# Patient Record
Sex: Male | Born: 1975 | Race: White | Hispanic: No | Marital: Single | Smoking: Current every day smoker
Health system: Southern US, Community
[De-identification: ages and names within clinical notes are randomized; demographics above are authoritative.]

## PROBLEM LIST (undated history)

## (undated) DIAGNOSIS — I639 Cerebral infarction, unspecified: Secondary | ICD-10-CM

## (undated) DIAGNOSIS — J45909 Unspecified asthma, uncomplicated: Secondary | ICD-10-CM

---

## 2010-01-20 DIAGNOSIS — I639 Cerebral infarction, unspecified: Secondary | ICD-10-CM

## 2010-01-20 HISTORY — DX: Cerebral infarction, unspecified: I63.9

## 2012-11-28 ENCOUNTER — Emergency Department (HOSPITAL_COMMUNITY)
Admission: EM | Admit: 2012-11-28 | Discharge: 2012-11-28 | Disposition: A | Discharge status: Home / Self Care | Payer: Medicaid Other | Attending: Emergency Medicine | Admitting: Emergency Medicine

## 2012-11-28 ENCOUNTER — Encounter (HOSPITAL_COMMUNITY): Payer: Self-pay | Admitting: Emergency Medicine

## 2012-11-28 ENCOUNTER — Emergency Department (HOSPITAL_COMMUNITY): Payer: Medicaid Other

## 2012-11-28 DIAGNOSIS — J45909 Unspecified asthma, uncomplicated: Secondary | ICD-10-CM | POA: Insufficient documentation

## 2012-11-28 DIAGNOSIS — Y9289 Other specified places as the place of occurrence of the external cause: Secondary | ICD-10-CM | POA: Insufficient documentation

## 2012-11-28 DIAGNOSIS — Y9389 Activity, other specified: Secondary | ICD-10-CM | POA: Insufficient documentation

## 2012-11-28 DIAGNOSIS — Z8673 Personal history of transient ischemic attack (TIA), and cerebral infarction without residual deficits: Secondary | ICD-10-CM | POA: Insufficient documentation

## 2012-11-28 DIAGNOSIS — G8190 Hemiplegia, unspecified affecting unspecified side: Secondary | ICD-10-CM | POA: Insufficient documentation

## 2012-11-28 DIAGNOSIS — IMO0002 Reserved for concepts with insufficient information to code with codable children: Secondary | ICD-10-CM | POA: Insufficient documentation

## 2012-11-28 DIAGNOSIS — F17200 Nicotine dependence, unspecified, uncomplicated: Secondary | ICD-10-CM | POA: Insufficient documentation

## 2012-11-28 DIAGNOSIS — W010XXA Fall on same level from slipping, tripping and stumbling without subsequent striking against object, initial encounter: Secondary | ICD-10-CM | POA: Insufficient documentation

## 2012-11-28 DIAGNOSIS — Z79899 Other long term (current) drug therapy: Secondary | ICD-10-CM | POA: Insufficient documentation

## 2012-11-28 DIAGNOSIS — S8391XA Sprain of unspecified site of right knee, initial encounter: Secondary | ICD-10-CM

## 2012-11-28 HISTORY — DX: Cerebral infarction, unspecified: I63.9

## 2012-11-28 HISTORY — DX: Unspecified asthma, uncomplicated: J45.909

## 2012-11-28 MED ORDER — OXYCODONE-ACETAMINOPHEN 5-325 MG PO TABS: 1.0000 | ORAL_TABLET | ORAL | Status: DC | PRN

## 2012-11-28 MED ORDER — OXYCODONE-ACETAMINOPHEN 5-325 MG PO TABS
1.0000 | ORAL_TABLET | Freq: Once | ORAL | Status: AC
  Administered 2012-11-28: 1 via ORAL
  Filled 2012-11-28: qty 1

## 2012-11-28 NOTE — ED Notes (Signed)
Pt c/o right knee pain, swelling and difficulty putting pressure on right leg d/t pain. Hx of stroke that caused right sided weakness. sts he fell yesterday in the shower. Denies hitting head/HA. No new neuro deficits. Pt has right sided weakness/numbness from previous stroke. Nad, skin warm and dry, resp e/u.

## 2012-11-28 NOTE — ED Provider Notes (Addendum)
CSN: [id number]     Arrival date & time 11/28/12  0848 History   First MD Initiated Contact with Patient 11/28/12 [phone]     Chief Complaint  Patient presents with  . Knee Pain   (Consider location/radiation/quality/duration/timing/severity/associated sxs/prior Treatment) HPI Comments: Patient presents to the ER for evaluation of right knee pain. Patient reports that he fell in the shower yesterday. 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 He lost his balance, causing the fall. He did cut himself with the other arm, no head injury. He says he felt fine after the fall, no pain. Overnight, however, he developed pain in the right knee. He notices this morning that the knee is swollen. Pain worsens with movement. Pain is sharp, constant.  Patient is a 37 y.o. male presenting with knee pain.  Knee Pain   Past Medical History  Diagnosis Date  . Asthma   . Stroke 2012    right sided deficits   History reviewed. No pertinent past surgical history. No family history on file. History  Substance Use Topics  . Smoking status: Current Every Day Smoker -- 0.50 packs/day    Types: Cigarettes  . Smokeless tobacco: Not on file  . Alcohol Use: No    Review of Systems  Musculoskeletal: Positive for arthralgias.  Neurological: Negative for dizziness, syncope and headaches.  All other systems reviewed and are negative.    Allergies  Review of patient's allergies indicates no known allergies.  Home Medications   Current Outpatient Rx  Name  Route  Sig  Dispense  Refill  . amitriptyline (ELAVIL) 100 MG tablet   Oral   Take 100 mg by mouth at bedtime.          There were no vitals taken for this visit. Physical Exam  Constitutional: He is oriented to person, place, and time. He appears well-developed and well-nourished. No distress.  HENT:  Head: Normocephalic and atraumatic.  Right Ear: Hearing normal.  Left Ear: Hearing normal.  Nose: Nose normal.  Mouth/Throat:  Oropharynx is clear and moist and mucous membranes are normal.  Eyes: Conjunctivae and EOM are normal. Pupils are equal, round, and reactive to light.  Neck: Normal range of motion. Neck supple.  Cardiovascular: Regular rhythm, S1 normal and S2 normal.  Exam reveals no gallop and no friction rub.   No murmur heard. Pulmonary/Chest: Effort normal and breath sounds normal. No respiratory distress. He exhibits no tenderness.  Abdominal: Soft. Normal appearance and bowel sounds are normal. There is no hepatosplenomegaly. There is no tenderness. There is no rebound, no guarding, no tenderness at McBurney's point and negative Murphy's sign. No hernia.  Musculoskeletal: Normal range of motion.       Right knee: He exhibits swelling and effusion. He exhibits no ecchymosis, no deformity and no erythema. Tenderness found.  Neurological: He is alert and oriented to person, place, and time. He has normal strength. No sensory deficit. GCS eye subscore is 4. GCS verbal subscore is 5. GCS motor subscore is 6.  Right hemiparesis is chronic, no new focal finding  Skin: Skin is warm, dry and intact. No rash noted. No cyanosis.  Psychiatric: He has a normal mood and affect. His speech is normal and behavior is normal. Thought content normal.    ED Course  Procedures (including critical care time) Labs Review Labs Reviewed - No data to display Imaging Review Dg Knee Complete 4 Views Right  11/28/2012   CLINICAL DATA:  Fall, knee pain  EXAM:  RIGHT KNEE - COMPLETE 4+ VIEW  COMPARISON:  None.  FINDINGS: Four views of the right knee submitted. No acute fracture or subluxation. No radiopaque foreign body. No joint effusion.  IMPRESSION: Negative.   Electronically Signed   By: Natasha Mead M.D.   On: 11/28/2012 10:03    EKG Interpretation   None       MDM  Diagnosis: Right knee sprain  Patient presents to the ER for evaluation of right knee injury. Patient had a fall yesterday. He reports pain began overnight  he now has swelling of the right knee. I feel a slight effusion, possibly prepatellar, possibly in the joint. There is no erythema or increased warmth. No suspicion for joint infection. X-ray does not show any acute abnormality. Patient having trouble with ambulation secondary to pain. The patient will be given a knee immobilizer for stability, Percocet for pain and is to followup with orthopedics. Return if symptoms worsen, especially fever, redness or increased swelling.    Gilda Crease, MD 11/28/12 1032  Gilda Crease, MD 11/28/12 1043

## 2012-11-28 NOTE — ED Notes (Signed)
Per EMS - pt fell in shower yesterday but no pain afterwards. Around 1am this morning he woke up with pain and swelling to right knee, unable to put a lot of pressure on leg. Pt has hx of stroke with right sided weakness but reports he is unable to walk on it d/t the pain not weakness. No deformities noted. BP 128/80 HR 80 RR 16. Rating pain at 8/10.

## 2012-12-09 ENCOUNTER — Emergency Department (HOSPITAL_COMMUNITY)
Admission: EM | Admit: 2012-12-09 | Discharge: 2012-12-09 | Disposition: A | Discharge status: Home / Self Care | Payer: Medicaid Other | Attending: Emergency Medicine | Admitting: Emergency Medicine

## 2012-12-09 ENCOUNTER — Encounter (HOSPITAL_COMMUNITY): Payer: Self-pay | Admitting: Emergency Medicine

## 2012-12-09 DIAGNOSIS — G8911 Acute pain due to trauma: Secondary | ICD-10-CM | POA: Insufficient documentation

## 2012-12-09 DIAGNOSIS — M25569 Pain in unspecified knee: Secondary | ICD-10-CM | POA: Insufficient documentation

## 2012-12-09 DIAGNOSIS — J45909 Unspecified asthma, uncomplicated: Secondary | ICD-10-CM | POA: Insufficient documentation

## 2012-12-09 DIAGNOSIS — M25561 Pain in right knee: Secondary | ICD-10-CM

## 2012-12-09 DIAGNOSIS — Z8673 Personal history of transient ischemic attack (TIA), and cerebral infarction without residual deficits: Secondary | ICD-10-CM | POA: Insufficient documentation

## 2012-12-09 DIAGNOSIS — F17200 Nicotine dependence, unspecified, uncomplicated: Secondary | ICD-10-CM | POA: Insufficient documentation

## 2012-12-09 DIAGNOSIS — Z79899 Other long term (current) drug therapy: Secondary | ICD-10-CM | POA: Insufficient documentation

## 2012-12-09 MED ORDER — TRAMADOL HCL 50 MG PO TABS: 50.0000 mg | ORAL_TABLET | Freq: Four times a day (QID) | ORAL | Status: DC | PRN

## 2012-12-09 NOTE — ED Notes (Signed)
Pt reports his right knee has been hurting for the past 8 days. Hx of stroke 2 yrs ago, residual right side weakness. Fell in shower 2 weeks ago and seen here and told to make appt with orthopedist and pt is scheduled to see the orthopedist next week on the 26th but now unable to deal with this pain and having trouble walking. A&Ox4, pt ambulated to room with cane, slow, steady gait. No other neuro deficits noted.

## 2012-12-09 NOTE — ED Provider Notes (Signed)
Medical screening examination/treatment/procedure(s) were performed by non-physician practitioner and as supervising physician I was immediately available for consultation/collaboration.   Lennon Boutwell, MD 12/09/12 2343 

## 2012-12-09 NOTE — ED Provider Notes (Signed)
CSN: [id number]     Arrival date & time 12/09/12  0544 History   First MD Initiated Contact with Patient 12/09/12 [phone]     Chief Complaint  Patient presents with  . Knee Pain   (Consider location/radiation/quality/duration/timing/severity/associated sxs/prior Treatment) HPI  37 year old male with prior stroke with right side residual deficit presents complaining of right knee pain. Patient fell in shower 11 days ago and injured his right knee. He was seen in the ED at the initial onset, x-ray performed showing no acute fracture; the patient was discharge with pain medication and referral to orthopedist. Patient reports since the fall he continues to have pain to his right knee. Describe pain as a sharp throbbing sensation to the back of the knee, nonradiating worsening with walking and improved with taking Percocet that was prescribed. He has ran out of his medication and the pain has not improved. He is having difficulty tolerating the pain. He continues to ice, and elevate his knee with minimal relief. With prior history of hemorrhagic stroke, patient does not like to take NSAIDs. Otherwise patient denies fever, rash, hip or ankle pain. Patient walks with a cane. He is scheduled to be seen at orthopedist next week on the 26th but is here for request of pain management.    Past Medical History  Diagnosis Date  . Asthma   . Stroke 2012    right sided deficits   History reviewed. No pertinent past surgical history. No family history on file. History  Substance Use Topics  . Smoking status: Current Every Day Smoker -- 0.50 packs/day    Types: Cigarettes  . Smokeless tobacco: Not on file  . Alcohol Use: No    Review of Systems  Constitutional: Negative for fever.  Skin: Negative for rash.    Allergies  Review of patient's allergies indicates no known allergies.  Home Medications   Current Outpatient Rx  Name  Route  Sig  Dispense  Refill  . albuterol (PROVENTIL HFA;VENTOLIN HFA)  108 (90 BASE) MCG/ACT inhaler   Inhalation   Inhale 2 puffs into the lungs every 6 (six) hours as needed for wheezing or shortness of breath.         Marland Kitchen amitriptyline (ELAVIL) 100 MG tablet   Oral   Take 100 mg by mouth at bedtime.         Marland Kitchen ibuprofen (ADVIL,MOTRIN) 600 MG tablet   Oral   Take 1,200 mg by mouth every 6 (six) hours as needed for moderate pain.         Marland Kitchen oxyCODONE-acetaminophen (PERCOCET) 5-325 MG per tablet   Oral   Take 1-2 tablets by mouth every 4 (four) hours as needed.   15 tablet   0    BP 118/69  Pulse 70  Temp(Src) 97.6 F (36.4 C) (Oral)  Resp 16  SpO2 99% Physical Exam  Nursing note and vitals reviewed. Constitutional: He appears well-developed and well-nourished. No distress.  HENT:  Head: Atraumatic.  Eyes: Conjunctivae are normal.  Neck: Neck supple.  Abdominal: There is no tenderness.  Musculoskeletal: He exhibits tenderness (R knee: mild tenderness to R posterior knee without overlying skin changes, deformity, rash, or decreased in ROM.  negative anterior/posterior drawer test.  pain with varus/valgus maneuver.  intact distal pulses).       Right hip: Normal.  Skin: Skin is warm. No rash noted.  Psychiatric: He has a normal mood and affect.    ED Course  Procedures (including critical care time)  6:20 AM Pain to R knee from prior injury.  No signs of infection.  Will prescribed a short course of pain meds, continues with RICE and pt to f/u with ortho as previously scheduled.  Pt able to ambulate.  Labs Review Labs Reviewed - No data to display Imaging Review No results found.  EKG Interpretation   None       MDM   1. Right knee pain    BP 118/69  Pulse 70  Temp(Src) 97.6 F (36.4 C) (Oral)  Resp 16  SpO2 99%     Fayrene Helper, PA-C 12/09/12 [phone]

## 2013-01-03 ENCOUNTER — Emergency Department (HOSPITAL_COMMUNITY)
Admission: EM | Admit: 2013-01-03 | Discharge: 2013-01-03 | Disposition: A | Discharge status: Home / Self Care | Payer: Medicaid Other | Attending: Emergency Medicine | Admitting: Emergency Medicine

## 2013-01-03 ENCOUNTER — Emergency Department (HOSPITAL_COMMUNITY): Payer: Medicaid Other

## 2013-01-03 DIAGNOSIS — J45909 Unspecified asthma, uncomplicated: Secondary | ICD-10-CM | POA: Insufficient documentation

## 2013-01-03 DIAGNOSIS — R52 Pain, unspecified: Secondary | ICD-10-CM | POA: Insufficient documentation

## 2013-01-03 DIAGNOSIS — Z79899 Other long term (current) drug therapy: Secondary | ICD-10-CM | POA: Insufficient documentation

## 2013-01-03 DIAGNOSIS — Z8673 Personal history of transient ischemic attack (TIA), and cerebral infarction without residual deficits: Secondary | ICD-10-CM | POA: Insufficient documentation

## 2013-01-03 DIAGNOSIS — M25551 Pain in right hip: Secondary | ICD-10-CM

## 2013-01-03 DIAGNOSIS — F17200 Nicotine dependence, unspecified, uncomplicated: Secondary | ICD-10-CM | POA: Insufficient documentation

## 2013-01-03 DIAGNOSIS — M25559 Pain in unspecified hip: Secondary | ICD-10-CM | POA: Insufficient documentation

## 2013-01-03 MED ORDER — HYDROCODONE-ACETAMINOPHEN 5-325 MG PO TABS
2.0000 | ORAL_TABLET | Freq: Once | ORAL | Status: AC
  Administered 2013-01-03: 2 via ORAL
  Filled 2013-01-03: qty 2

## 2013-01-03 MED ORDER — HYDROCODONE-ACETAMINOPHEN 5-325 MG PO TABS: 2.0000 | ORAL_TABLET | Freq: Four times a day (QID) | ORAL | Status: AC | PRN

## 2013-01-03 NOTE — ED Notes (Signed)
Rt hip pain since Friday states that had a stroke 2 years ago and walks diff now and thinks that may be cause no new injury

## 2013-01-03 NOTE — ED Provider Notes (Signed)
CSN: [id number]     Arrival date & time 01/03/13  4098 History   First MD Initiated Contact with Patient 01/03/13 0848     Chief Complaint  Patient presents with  . Hip Pain   (Consider location/radiation/quality/duration/timing/severity/associated sxs/prior Treatment) HPI Comments: Patient presents to the emergency department with chief complaint of right hip pain. Patient remarks that he had a stroke approximately 2 years ago, and has suffered right lower extremity weakness since the incident. He states that he now has to modify his gait secondary to the stroke. He states that he has suffered with knee pain, and now hip pain. He is tried taking Aleve with no relief. He has seen an orthopedic doctor approximately 3 weeks ago for his knee pain, and was told that it was inflammation. He states that his pain is moderate to severe. Rest makes pain better, but movement makes his pain worse.  The history is provided by the patient. No language interpreter was used.    Past Medical History  Diagnosis Date  . Asthma   . Stroke 2012    right sided deficits   No past surgical history on file. No family history on file. History  Substance Use Topics  . Smoking status: Current Every Day Smoker -- 0.50 packs/day    Types: Cigarettes  . Smokeless tobacco: Not on file  . Alcohol Use: No    Review of Systems  All other systems reviewed and are negative.    Allergies  Review of patient's allergies indicates no known allergies.  Home Medications   Current Outpatient Rx  Name  Route  Sig  Dispense  Refill  . albuterol (PROVENTIL HFA;VENTOLIN HFA) 108 (90 BASE) MCG/ACT inhaler   Inhalation   Inhale 2 puffs into the lungs every 6 (six) hours as needed for wheezing or shortness of breath.          BP 139/57  Pulse 71  Temp(Src) 98.1 F (36.7 C) (Oral)  Resp 20  Ht 6' (1.829 m)  Wt 170 lb (77.111 kg)  BMI 23.05 kg/m2  SpO2 99% Physical Exam  Nursing note and vitals  reviewed. Constitutional: He is oriented to person, place, and time. He appears well-developed and well-nourished.  HENT:  Head: Normocephalic and atraumatic.  Eyes: Conjunctivae and EOM are normal.  Neck: Normal range of motion.  Cardiovascular: Normal rate.   Pulmonary/Chest: Effort normal.  Abdominal: He exhibits no distension.  Musculoskeletal: Normal range of motion.  Right hip range of motion and strength limited secondary to pain, and previous stroke, no palpable tenderness, no obvious bony abnormality or deformity  Neurological: He is alert and oriented to person, place, and time.  Skin: Skin is dry.  Psychiatric: He has a normal mood and affect. His behavior is normal. Judgment and thought content normal.    ED Course  Procedures (including critical care time) No results found for this or any previous visit. Dg Hip Complete Right  01/03/2013   CLINICAL DATA:  Fall 1 week ago, right hip pain  EXAM: RIGHT HIP - COMPLETE 2+ VIEW  COMPARISON:  None.  FINDINGS: Three views of the right hip submitted. No acute fracture or subluxation. A metallic BB is noted in right pelvis.  IMPRESSION: Negative.   Electronically Signed   By: Natasha Mead M.D.   On: 01/03/2013 09:33      EKG Interpretation   None       MDM   1. Hip pain, acute, right  Patient with right hip pain. Will order an x-ray, and will reevaluate. Anticipate discharge to orthopedics.  Plain films negative. Will discharge with pain medicine, and orthopedic followup. Patient understands and agrees to plan. He is stable and ready for discharge.    Roxy Horseman, PA-C 01/03/13 [phone]

## 2013-01-04 NOTE — ED Provider Notes (Signed)
Medical screening examination/treatment/procedure(s) were performed by non-physician practitioner and as supervising physician I was immediately available for consultation/collaboration.  EKG Interpretation   None         Keyuana Wank M Sherronda Sweigert, DO 01/04/13 0552 

## 2013-06-22 ENCOUNTER — Encounter (HOSPITAL_COMMUNITY): Payer: Self-pay | Admitting: Emergency Medicine

## 2013-06-22 ENCOUNTER — Emergency Department (HOSPITAL_COMMUNITY)
Admission: EM | Admit: 2013-06-22 | Discharge: 2013-06-22 | Discharge status: Against Medical Advice | Payer: Medicaid Other | Attending: Emergency Medicine | Admitting: Emergency Medicine

## 2013-06-22 DIAGNOSIS — R51 Headache: Secondary | ICD-10-CM | POA: Insufficient documentation

## 2013-06-22 NOTE — ED Notes (Addendum)
Pt states he has been having a headache for the past two days and nothing OTC has been helping. Hx of stroke in 2012 and is worrying him. Nausea but no vomiting. Deficits from the previous stroke are on the right side and states it is numb and can't move it which are normal.

## 2013-07-22 ENCOUNTER — Encounter (HOSPITAL_BASED_OUTPATIENT_CLINIC_OR_DEPARTMENT_OTHER): Payer: Self-pay | Admitting: Emergency Medicine

## 2013-07-22 ENCOUNTER — Emergency Department (HOSPITAL_BASED_OUTPATIENT_CLINIC_OR_DEPARTMENT_OTHER): Payer: Medicaid Other

## 2013-07-22 ENCOUNTER — Emergency Department (HOSPITAL_BASED_OUTPATIENT_CLINIC_OR_DEPARTMENT_OTHER)
Admission: EM | Admit: 2013-07-22 | Discharge: 2013-07-22 | Disposition: A | Discharge status: Home / Self Care | Payer: Medicaid Other | Attending: Emergency Medicine | Admitting: Emergency Medicine

## 2013-07-22 DIAGNOSIS — S20219A Contusion of unspecified front wall of thorax, initial encounter: Secondary | ICD-10-CM | POA: Insufficient documentation

## 2013-07-22 DIAGNOSIS — S20211A Contusion of right front wall of thorax, initial encounter: Secondary | ICD-10-CM

## 2013-07-22 DIAGNOSIS — Z8673 Personal history of transient ischemic attack (TIA), and cerebral infarction without residual deficits: Secondary | ICD-10-CM | POA: Insufficient documentation

## 2013-07-22 DIAGNOSIS — S298XXA Other specified injuries of thorax, initial encounter: Secondary | ICD-10-CM | POA: Diagnosis present

## 2013-07-22 DIAGNOSIS — J45909 Unspecified asthma, uncomplicated: Secondary | ICD-10-CM | POA: Insufficient documentation

## 2013-07-22 DIAGNOSIS — Y9389 Activity, other specified: Secondary | ICD-10-CM | POA: Insufficient documentation

## 2013-07-22 DIAGNOSIS — Y9289 Other specified places as the place of occurrence of the external cause: Secondary | ICD-10-CM | POA: Insufficient documentation

## 2013-07-22 DIAGNOSIS — W010XXA Fall on same level from slipping, tripping and stumbling without subsequent striking against object, initial encounter: Secondary | ICD-10-CM | POA: Insufficient documentation

## 2013-07-22 DIAGNOSIS — F17200 Nicotine dependence, unspecified, uncomplicated: Secondary | ICD-10-CM | POA: Insufficient documentation

## 2013-07-22 DIAGNOSIS — W19XXXA Unspecified fall, initial encounter: Secondary | ICD-10-CM

## 2013-07-22 MED ORDER — NAPROXEN 500 MG PO TABS: 500.0000 mg | ORAL_TABLET | Freq: Two times a day (BID) | ORAL | Status: AC

## 2013-07-22 MED ORDER — HYDROCODONE-ACETAMINOPHEN 5-325 MG PO TABS: 1.0000 | ORAL_TABLET | ORAL | Status: AC | PRN

## 2013-07-22 MED ORDER — OXYCODONE-ACETAMINOPHEN 5-325 MG PO TABS
1.0000 | ORAL_TABLET | Freq: Once | ORAL | Status: AC
  Administered 2013-07-22: 1 via ORAL
  Filled 2013-07-22: qty 1

## 2013-07-22 NOTE — ED Provider Notes (Signed)
CSN: [id number]     Arrival date & time 07/22/13  1413 History   First MD Initiated Contact with Patient 07/22/13 1529     Chief Complaint  Patient presents with  . Back Pain     (Consider location/radiation/quality/duration/timing/severity/associated sxs/prior Treatment) Patient is a 38 y.o. male presenting with fall. The history is provided by the patient.  Fall This is a new problem. The current episode started in the past 7 days. The problem has been unchanged. Pertinent negatives include no abdominal pain, anorexia, change in bowel habit, chills, coughing, diaphoresis, fever, headaches, myalgias, nausea or vomiting. Chest pain: right rib area. Weakness: chronic s/p stroke. The symptoms are aggravated by bending, standing and walking.   Fernando Harvey is a 38 y.o. male who presents to the ED with right rib pain. He states that he was in the kitchen and slipped and fell 2 days ago. He is having pain in the right rib area. [facility]. He has no strength in his right arm and decreased strength in his right leg. He denies head injury with the fall or LOC. He denies any other injuries or problems today.   Past Medical History  Diagnosis Date  . Asthma   . Stroke 2012    right sided deficits   History reviewed. No pertinent past surgical history. No family history on file. History  Substance Use Topics  . Smoking status: Current Every Day Smoker -- 0.50 packs/day    Types: Cigarettes  . Smokeless tobacco: Not on file  . Alcohol Use: No    Review of Systems  Constitutional: Negative for fever, chills and diaphoresis.  HENT: Negative.   Eyes: Negative for visual disturbance.  Respiratory: Negative for cough, shortness of breath and wheezing.   Cardiovascular: Chest pain: right rib area.  Gastrointestinal: Negative for nausea, vomiting, abdominal pain, anorexia and change in bowel habit.  Genitourinary: Negative for dysuria and  frequency.  Musculoskeletal: Negative for myalgias.       Right rib pain   Skin: Negative for wound.  Neurological: Negative for syncope, light-headedness and headaches. Weakness: chronic s/p stroke.  Psychiatric/Behavioral: Negative for confusion.      Allergies  Review of patient's allergies indicates no known allergies.  Home Medications   Prior to Admission medications   Medication Sig Start Date End Date Taking? Authorizing Provider  albuterol (PROVENTIL HFA;VENTOLIN HFA) 108 (90 BASE) MCG/ACT inhaler Inhale 2 puffs into the lungs every 6 (six) hours as needed for wheezing or shortness of breath.    Historical Provider, MD  HYDROcodone-acetaminophen (NORCO/VICODIN) 5-325 MG per tablet Take 2 tablets by mouth every 6 (six) hours as needed. 01/03/13   Roxy Horsemanobert Browning, PA-C   BP 122/71  Pulse 72  Temp(Src) 97.4 F (36.3 C) (Oral)  Resp 18  Ht 6\' 1"  (1.854 m)  Wt 170 lb (77.111 kg)  BMI 22.43 kg/m2  SpO2 99% Physical Exam  Nursing note and vitals reviewed. Constitutional: He is oriented to person, place, and time. He appears well-developed and well-nourished. No distress.  HENT:  Head: Normocephalic and atraumatic.  Eyes: EOM are normal. Pupils are equal, round, and reactive to light.  Neck: Normal range of motion. Neck supple.  Cardiovascular: Normal rate and regular rhythm.   Pulmonary/Chest: Effort normal. No respiratory distress. He has no wheezes. He has no rales.    Tender on palpation right anterior and posterior rib area.   Abdominal: Soft. Bowel  sounds are normal. There is no tenderness.  Musculoskeletal: Normal range of motion. He exhibits no edema.       Back:  Neurological: He is alert and oriented to person, place, and time.  Deficits on the right due to stroke 2012.   Skin: Skin is warm and dry.  Psychiatric: He has a normal mood and affect. His behavior is normal.    ED Course  Procedures  Dg Ribs Unilateral W/chest Right  07/22/2013   CLINICAL  DATA:  Right posterior rib pain status post trauma ; history of previous CVA and right hemiparesis  EXAM: RIGHT RIBS AND CHEST - 3+ VIEW  COMPARISON:  None.  FINDINGS: The lungs are well-expanded and clear. There is no pneumothorax or pleural effusion. The heart and mediastinal structures are normal. A metallic BB was placed over the symptomatic posterior right lower ribs. There is no acute fracture nor dislocation.  IMPRESSION: There is no acute rib fracture nor acute cardiopulmonary abnormality.   Electronically Signed   By: David  [place]   On: 07/22/2013 17:19    MDM  38 y.o. male with right rib pain s/p fall 2 days ago. I have reviewed this patient's vital signs, nurses notes, appropriate labs and imaging.  I have discussed findings with the patient and plan of care. He voices understanding and agrees with plan. Stable for discharge without any new neuro deficits. Will treat for rib contusion and he will apply ice, rest and take the medications as directed for pain. He is to follow up with his PCP.    Medication List    TAKE these medications       naproxen 500 MG tablet  Commonly known as:  NAPROSYN  Take 1 tablet (500 mg total) by mouth 2 (two) times daily.      ASK your doctor about these medications       albuterol 108 (90 BASE) MCG/ACT inhaler  Commonly known as:  PROVENTIL HFA;VENTOLIN HFA  Inhale 2 puffs into the lungs every 6 (six) hours as needed for wheezing or shortness of breath.     HYDROcodone-acetaminophen 5-325 MG per tablet  Commonly known as:  NORCO/VICODIN  Take 2 tablets by mouth every 6 (six) hours as needed.  Ask about: Which instructions should I use?     HYDROcodone-acetaminophen 5-325 MG per tablet  Commonly known as:  NORCO/VICODIN  Take 1-2 tablets by mouth every 4 (four) hours as needed.  Ask about: Which instructions should I use?           Baroda[place] M Leyli Kevorkian, [place] 07/22/13 2139

## 2013-07-22 NOTE — Discharge Instructions (Signed)
Your x-ray today shows no fracture of the right ribs or problems with the lungs. Apply ice to the area. Take the medication as directed and not on an empty stomach. Follow up with your doctor or return here as needed for problems.

## 2013-07-22 NOTE — ED Notes (Signed)
Slipped and fell 2 days ago. Pain in his lower back worse on the right.

## 2013-07-23 NOTE — ED Provider Notes (Signed)
Medical screening examination/treatment/procedure(s) were performed by non-physician practitioner and as supervising physician I was immediately available for consultation/collaboration.   EKG Interpretation None        Terreon Ekholm, MD 07/23/13 1512 

## 2013-08-13 ENCOUNTER — Encounter (HOSPITAL_BASED_OUTPATIENT_CLINIC_OR_DEPARTMENT_OTHER): Payer: Self-pay | Admitting: Emergency Medicine

## 2013-08-13 ENCOUNTER — Emergency Department (HOSPITAL_BASED_OUTPATIENT_CLINIC_OR_DEPARTMENT_OTHER)
Admission: EM | Admit: 2013-08-13 | Discharge: 2013-08-13 | Disposition: A | Discharge status: Home / Self Care | Payer: Medicaid Other | Attending: Emergency Medicine | Admitting: Emergency Medicine

## 2013-08-13 ENCOUNTER — Emergency Department (HOSPITAL_BASED_OUTPATIENT_CLINIC_OR_DEPARTMENT_OTHER): Payer: Medicaid Other

## 2013-08-13 DIAGNOSIS — Z8673 Personal history of transient ischemic attack (TIA), and cerebral infarction without residual deficits: Secondary | ICD-10-CM | POA: Insufficient documentation

## 2013-08-13 DIAGNOSIS — F17200 Nicotine dependence, unspecified, uncomplicated: Secondary | ICD-10-CM | POA: Diagnosis not present

## 2013-08-13 DIAGNOSIS — M7650 Patellar tendinitis, unspecified knee: Secondary | ICD-10-CM | POA: Diagnosis not present

## 2013-08-13 DIAGNOSIS — M76899 Other specified enthesopathies of unspecified lower limb, excluding foot: Secondary | ICD-10-CM

## 2013-08-13 DIAGNOSIS — J45909 Unspecified asthma, uncomplicated: Secondary | ICD-10-CM | POA: Diagnosis not present

## 2013-08-13 DIAGNOSIS — M25569 Pain in unspecified knee: Secondary | ICD-10-CM | POA: Insufficient documentation

## 2013-08-13 MED ORDER — TRAMADOL HCL 50 MG PO TABS: 50.0000 mg | ORAL_TABLET | Freq: Four times a day (QID) | ORAL | Status: AC | PRN

## 2013-08-13 NOTE — ED Provider Notes (Signed)
CSN: [id number]     Arrival date & time 08/13/13  1129 History   First MD Initiated Contact with Patient 08/13/13 1258     Chief Complaint  Patient presents with  . Knee Pain     (Consider location/radiation/quality/duration/timing/severity/associated sxs/prior Treatment) Patient is a 38 y.o. male presenting with knee pain. The history is provided by the patient. No language interpreter was used.  Knee Pain Location:  Knee Injury: no   Knee location:  L knee Relieved by:  Ice Associated symptoms: no fever   Associated symptoms comment:  Left knee pain anteriorly for the past 3-4 days without known injury. He reports history of stroke with chronic right sided lower extremity weakness and he feels he strains the left in compensation periodically. No falls, redness or fever. No calf or thigh pain.   Past Medical History  Diagnosis Date  . Asthma   . Stroke 2012    right sided deficits   History reviewed. No pertinent past surgical history. No family history on file. History  Substance Use Topics  . Smoking status: Current Every Day Smoker -- 0.50 packs/day    Types: Cigarettes  . Smokeless tobacco: Not on file  . Alcohol Use: No    Review of Systems  Constitutional: Negative for fever and chills.  Musculoskeletal:       See HPI  Skin: Negative.  Negative for color change and wound.  Neurological: Negative.  Negative for numbness.      Allergies  Review of patient's allergies indicates no known allergies.  Home Medications   Prior to Admission medications   Medication Sig Start Date End Date Taking? Authorizing Provider  albuterol (PROVENTIL HFA;VENTOLIN HFA) 108 (90 BASE) MCG/ACT inhaler Inhale 2 puffs into the lungs every 6 (six) hours as needed for wheezing or shortness of breath.    Historical Provider, MD  HYDROcodone-acetaminophen (NORCO/VICODIN) 5-325 MG per tablet Take 2 tablets by mouth every 6 (six) hours as needed. 01/03/13   Roxy Horseman, PA-C   HYDROcodone-acetaminophen (NORCO/VICODIN) 5-325 MG per tablet Take 1-2 tablets by mouth every 4 (four) hours as needed. 07/22/13   Hope Orlene Och, NP  naproxen (NAPROSYN) 500 MG tablet Take 1 tablet (500 mg total) by mouth 2 (two) times daily. 07/22/13   Hope Orlene Och, NP   BP 124/66  Pulse 88  Temp(Src) 97.9 F (36.6 C) (Oral)  Resp 18  Ht 6\' 1"  (1.854 m)  Wt 175 lb (79.379 kg)  BMI 23.09 kg/m2  SpO2 97% Physical Exam  Constitutional: He is oriented to person, place, and time. He appears well-developed and well-nourished.  Neck: Normal range of motion.  Pulmonary/Chest: Effort normal.  Musculoskeletal: Normal range of motion.  Left knee unremarkable in appearance without swelling or discoloration. Mildly tender of infrapatellar tendon. Joint stable. No calf or thigh tenderness.   Neurological: He is alert and oriented to person, place, and time.  Skin: Skin is warm and dry.  Psychiatric: He has a normal mood and affect.    ED Course  Procedures (including critical care time) Labs Review Labs Reviewed - No data to display  Imaging Review Dg Knee Complete 4 Views Left  08/13/2013   CLINICAL DATA:  pain  EXAM: LEFT KNEE - COMPLETE 4+ VIEW  COMPARISON:  08/02/2012 by report only  FINDINGS: There is no evidence of fracture, dislocation, or joint effusion. There is no evidence of arthropathy or other focal bone abnormality. Soft tissues are unremarkable.  IMPRESSION: Negative.   Electronically Signed  By: Oley Balmaniel  Hassell M.D.   On: 08/13/2013 12:46     EKG Interpretation None      MDM   Final diagnoses:  None    1. Mild tendonitis left knee  The patient has obtained 4 narcotic Rx's since 05/27/13. Discussed chronic pain management under PCP care. Will treat with limited number Norco and encourage PCP follow up.    Arnoldo HookerShari A Zed Wanninger, PA-C 08/13/13 1326

## 2013-08-13 NOTE — Discharge Instructions (Signed)
Cryotherapy °Cryotherapy means treatment with cold. Ice or gel packs can be used to reduce both pain and swelling. Ice is the most helpful within the first 24 to 48 hours after an injury or flare-up from overusing a muscle or joint. Sprains, strains, spasms, burning pain, shooting pain, and aches can all be eased with ice. Ice can also be used when recovering from surgery. Ice is effective, has very few side effects, and is safe for most people to use. °PRECAUTIONS  °Ice is not a safe treatment option for people with: °· Raynaud phenomenon. This is a condition affecting small blood vessels in the extremities. Exposure to cold may cause your problems to return. °· Cold hypersensitivity. There are many forms of cold hypersensitivity, including: °¨ Cold urticaria. Red, itchy hives appear on the skin when the tissues begin to warm after being iced. °¨ Cold erythema. This is a red, itchy rash caused by exposure to cold. °¨ Cold hemoglobinuria. Red blood cells break down when the tissues begin to warm after being iced. The hemoglobin that carry oxygen are passed into the urine because they cannot combine with blood proteins fast enough. °· Numbness or altered sensitivity in the area being iced. °If you have any of the following conditions, do not use ice until you have discussed cryotherapy with your caregiver: °· Heart conditions, such as arrhythmia, angina, or chronic heart disease. °· High blood pressure. °· Healing wounds or open skin in the area being iced. °· Current infections. °· Rheumatoid arthritis. °· Poor circulation. °· Diabetes. °Ice slows the blood flow in the region it is applied. This is beneficial when trying to stop inflamed tissues from spreading irritating chemicals to surrounding tissues. However, if you expose your skin to cold temperatures for too long or without the proper protection, you can damage your skin or nerves. Watch for signs of skin damage due to cold. °HOME CARE INSTRUCTIONS °Follow  these tips to use ice and cold packs safely. °· Place a dry or damp towel between the ice and skin. A damp towel will cool the skin more quickly, so you may need to shorten the time that the ice is used. °· For a more rapid response, add gentle compression to the ice. °· Ice for no more than 10 to 20 minutes at a time. The bonier the area you are icing, the less time it will take to get the benefits of ice. °· Check your skin after 5 minutes to make sure there are no signs of a poor response to cold or skin damage. °· Rest 20 minutes or more between uses. °· Once your skin is numb, you can end your treatment. You can test numbness by very lightly touching your skin. The touch should be so light that you do not see the skin dimple from the pressure of your fingertip. When using ice, most people will feel these normal sensations in this order: cold, burning, aching, and numbness. °· Do not use ice on someone who cannot communicate their responses to pain, such as small children or people with dementia. °HOW TO MAKE AN ICE PACK °Ice packs are the most common way to use ice therapy. Other methods include ice massage, ice baths, and cryosprays. Muscle creams that cause a cold, tingly feeling do not offer the same benefits that ice offers and should not be used as a substitute unless recommended by your caregiver. °To make an ice pack, do one of the following: °· Place crushed ice or a   bag of frozen vegetables in a sealable plastic bag. Squeeze out the excess air. Place this bag inside another plastic bag. Slide the bag into a pillowcase or place a damp towel between your skin and the bag. °· Mix 3 parts water with 1 part rubbing alcohol. Freeze the mixture in a sealable plastic bag. When you remove the mixture from the freezer, it will be slushy. Squeeze out the excess air. Place this bag inside another plastic bag. Slide the bag into a pillowcase or place a damp towel between your skin and the bag. °SEEK MEDICAL CARE  IF: °· You develop white spots on your skin. This may give the skin a blotchy (mottled) appearance. °· Your skin turns blue or pale. °· Your skin becomes waxy or hard. °· Your swelling gets worse. °MAKE SURE YOU:  °· Understand these instructions. °· Will watch your condition. °· Will get help right away if you are not doing well or get worse. °Document Released: 09/02/2010 Document Revised: 05/23/2013 Document Reviewed: 09/02/2010 °ExitCare® Patient Information ©2015 [employer]. This information is not intended to replace advice given to you by your health care provider. Make sure you discuss any questions you have with your health care provider. °Heat Therapy °Heat therapy can help ease sore, stiff, injured, and tight muscles and joints. Heat relaxes your muscles, which may help ease your pain.  °RISKS AND COMPLICATIONS °If you have any of the following conditions, do not use heat therapy unless your health care provider has approved: °· Poor circulation. °· Healing wounds or scarred skin in the area being treated. °· Diabetes, heart disease, or high blood pressure. °· Not being able to feel (numbness) the area being treated. °· Unusual swelling of the area being treated. °· Active infections. °· Blood clots. °· Cancer. °· Inability to communicate pain. This may include young children and people who have problems with their brain function (dementia). °· Pregnancy. °Heat therapy should only be used on old, pre-existing, or long-lasting (chronic) injuries. Do not use heat therapy on new injuries unless directed by your health care provider. °HOW TO USE HEAT THERAPY °There are several different kinds of heat therapy, including: °· Moist heat pack. °· Warm water bath. °· Hot water bottle. °· Electric heating pad. °· Heated gel pack. °· Heated wrap. °· Electric heating pad. °Use the heat therapy method suggested by your health care provider. Follow your health care provider's instructions on when and how to use heat  therapy. °GENERAL HEAT THERAPY RECOMMENDATIONS °· Do not sleep while using heat therapy. Only use heat therapy while you are awake. °· Your skin may turn pink while using heat therapy. Do not use heat therapy if your skin turns red. °· Do not use heat therapy if you have new pain. °· High heat or long exposure to heat can cause burns. Be careful when using heat therapy to avoid burning your skin. °· Do not use heat therapy on areas of your skin that are already irritated, such as with a rash or sunburn. °SEEK MEDICAL CARE IF: °· You have blisters, redness, swelling, or numbness. °· You have new pain. °· Your pain is worse. °MAKE SURE YOU: °· Understand these instructions. °· Will watch your condition. °· Will get help right away if you are not doing well or get worse. °Document Released: 03/31/2011 Document Revised: 05/23/2013 Document Reviewed: 03/01/2013 °ExitCare® Patient Information ©2015 [employer]. This information is not intended to replace advice given to you by your health care provider.   Make sure you discuss any questions you have with your health care provider. Tendinitis Tendinitis is swelling and inflammation of the tendons. Tendons are band-like tissues that connect muscle to bone. Tendinitis commonly occurs in the:   Shoulders (rotator cuff).  Heels (Achilles tendon).  Elbows (triceps tendon). CAUSES Tendinitis is usually caused by overusing the tendon, muscles, and joints involved. When the tissue surrounding a tendon (synovium) becomes inflamed, it is called tenosynovitis. Tendinitis commonly develops in people whose jobs require repetitive motions. SYMPTOMS  Pain.  Tenderness.  Mild swelling. DIAGNOSIS Tendinitis is usually diagnosed by physical exam. Your health care provider may also order X-rays or other imaging tests. TREATMENT Your health care provider may recommend certain medicines or exercises for your treatment. HOME CARE INSTRUCTIONS   Use a sling or splint for  as long as directed by your health care provider until the pain decreases.  Put ice on the injured area.  Put ice in a plastic bag.  Place a towel between your skin and the bag.  Leave the ice on for 15-20 minutes, 3-4 times a day, or as directed by your health care provider.  Avoid using the limb while the tendon is painful. Perform gentle range of motion exercises only as directed by your health care provider. Stop exercises if pain or discomfort increase, unless directed otherwise by your health care provider.  Only take over-the-counter or prescription medicines for pain, discomfort, or fever as directed by your health care provider. SEEK MEDICAL CARE IF:   Your pain and swelling increase.  You develop new, unexplained symptoms, especially increased numbness in the hands. MAKE SURE YOU:   Understand these instructions.  Will watch your condition.  Will get help right away if you are not doing well or get worse. Document Released: 01/04/2000 Document Revised: 05/23/2013 Document Reviewed: 03/25/2010 [facility] Patient Information 2015 [employer], [place]. This information is not intended to replace advice given to you by your health care provider. Make sure you discuss any questions you have with your health care provider.

## 2013-08-13 NOTE — ED Provider Notes (Signed)
Medical screening examination/treatment/procedure(s) were performed by non-physician practitioner and as supervising physician I was immediately available for consultation/collaboration.   EKG Interpretation None       Doug SouSam Cassadi Purdie, MD 08/13/13 1635

## 2013-08-13 NOTE — ED Notes (Addendum)
Pt c/o left knee pain x4-5 days. Pt reports no relief with ice or heat therapy.

## 2013-08-13 NOTE — ED Notes (Signed)
Pt discharged to home NAD.  

## 2015-03-15 IMAGING — CR DG KNEE COMPLETE 4+V*L*
4 series · 4 of 4 positions shown · non-contrast
Comparison: 08/02/2012 by report only

CLINICAL DATA: pain

EXAM:
LEFT KNEE - COMPLETE 4+ VIEW

[t knee ap right]
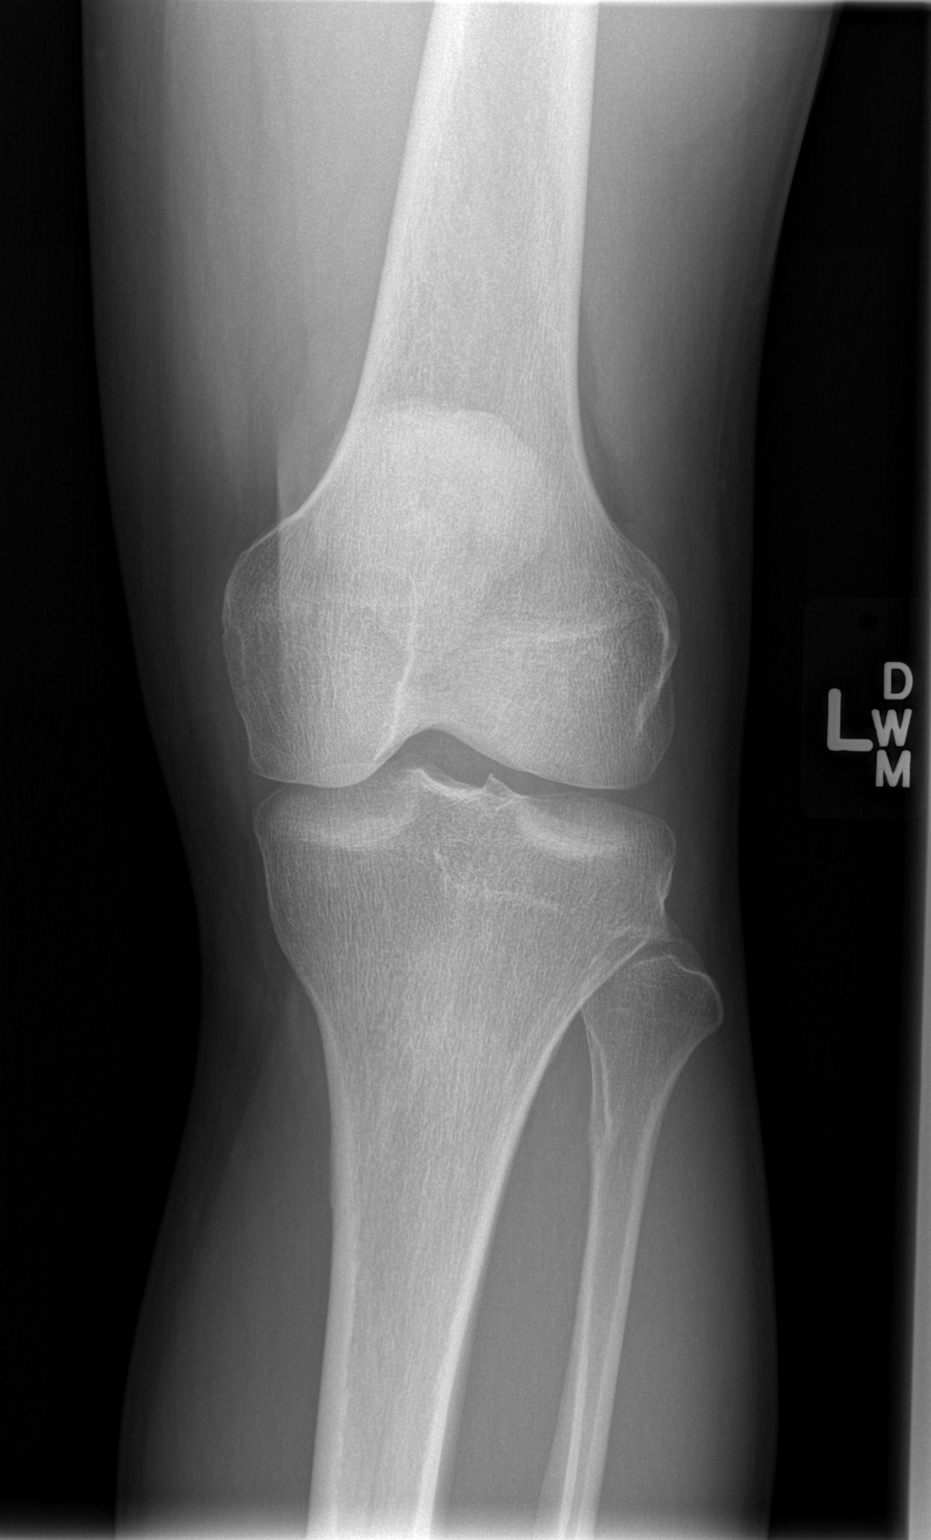

[t knee oblique right (1 of 2)]
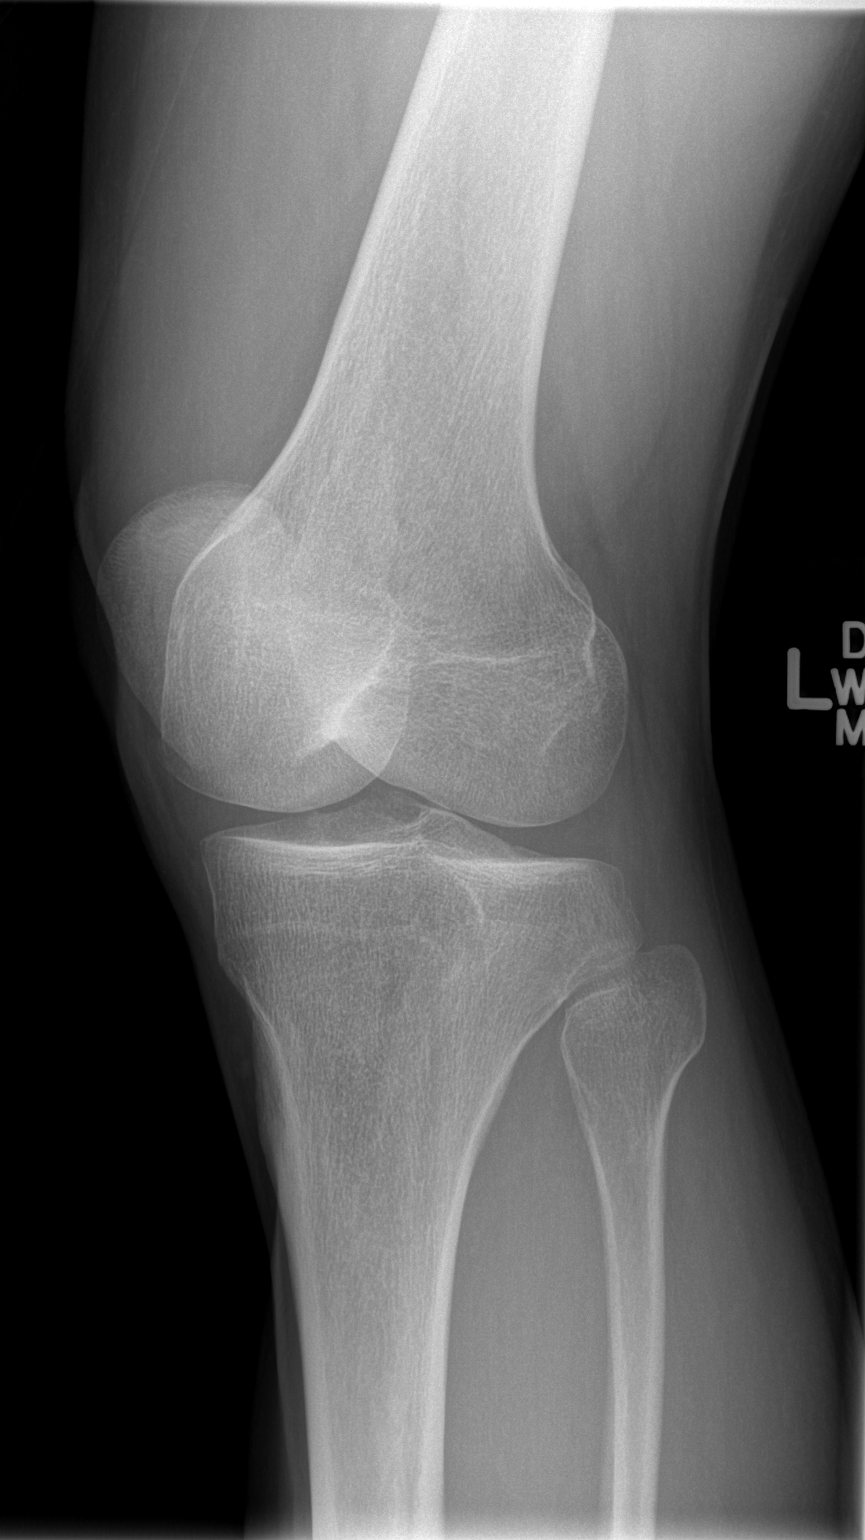

[t knee oblique right (2 of 2)]
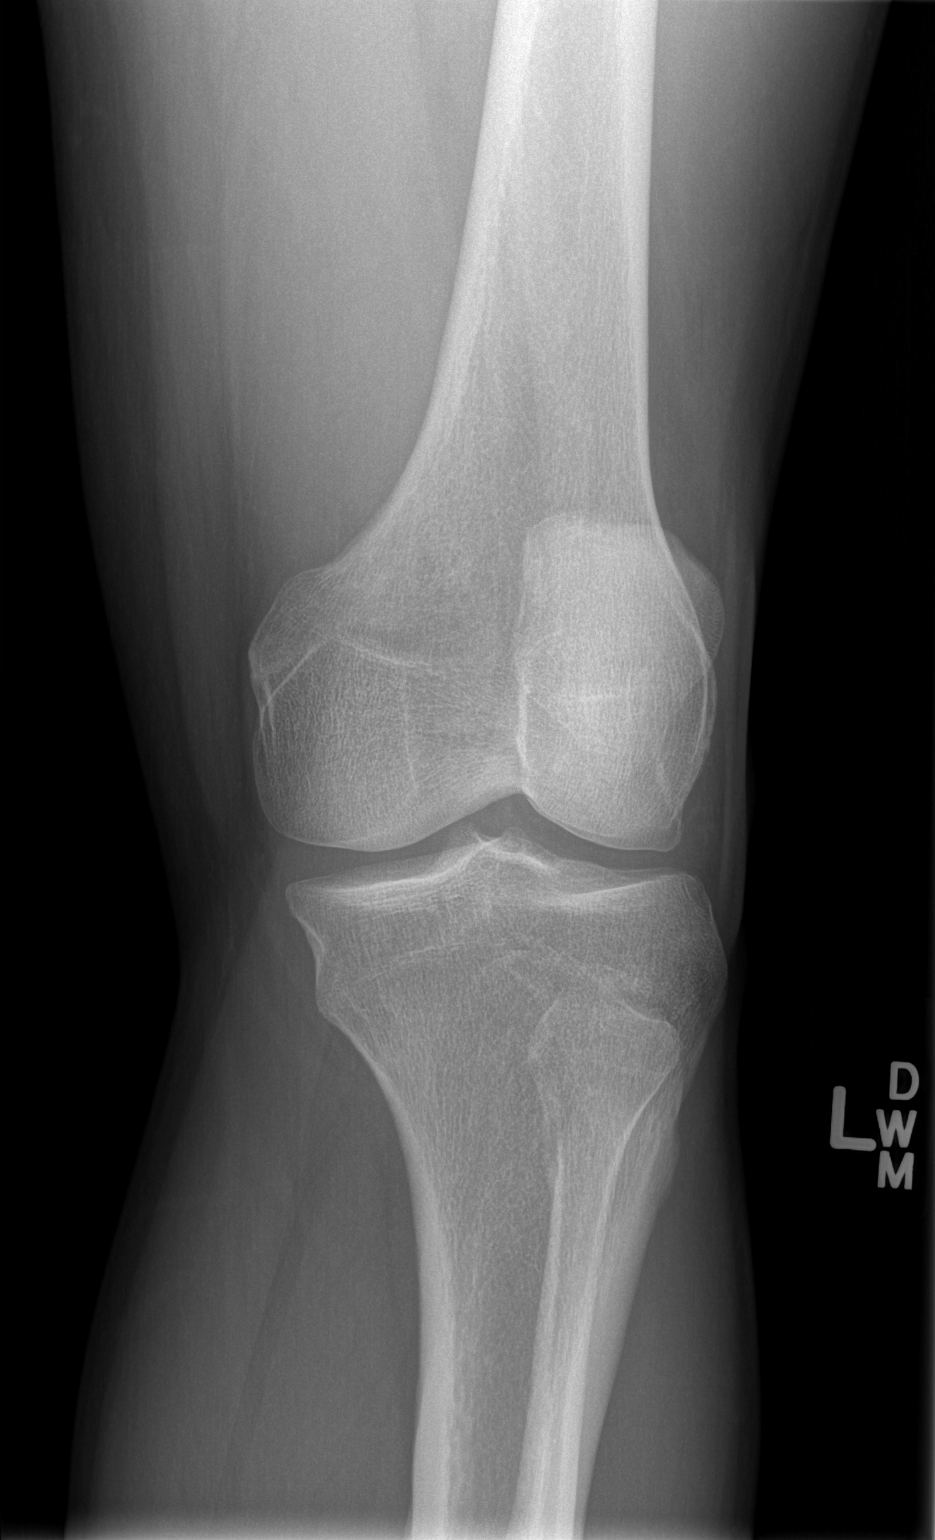

[t knee lat right]
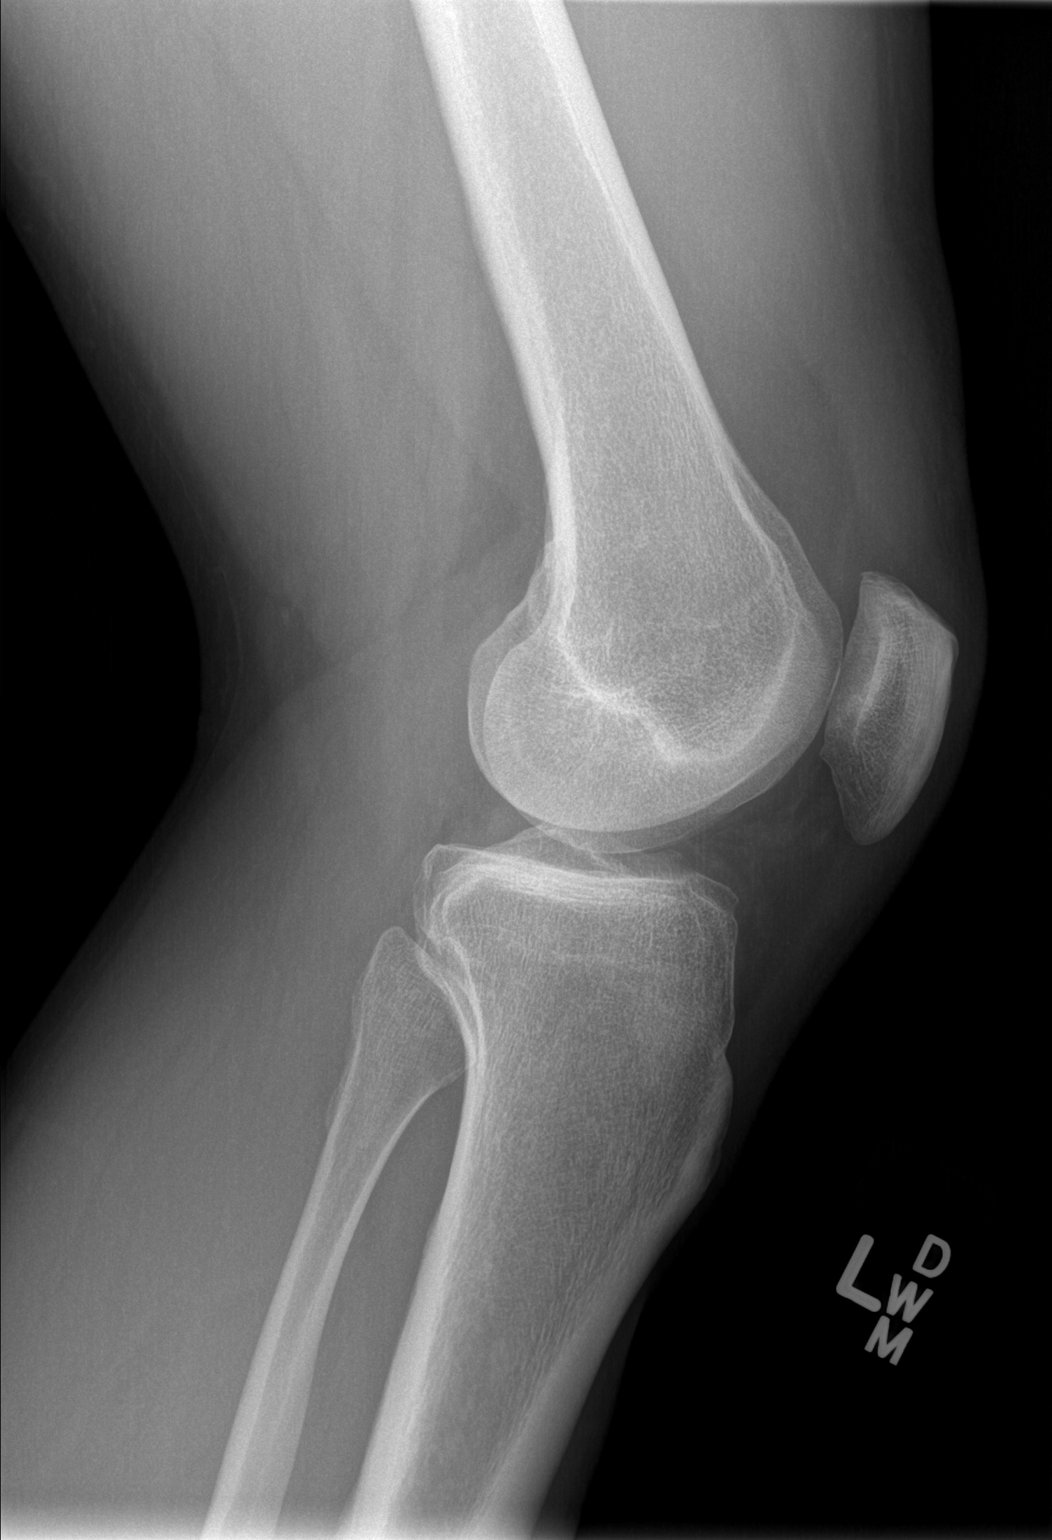

[4 of 4 positions shown; findings below may reference images not displayed]

FINDINGS: There is no evidence of fracture, dislocation, or joint effusion.
There is no evidence of arthropathy or other focal bone abnormality.
Soft tissues are unremarkable.
IMPRESSION: Negative.

## 2020-12-05 ENCOUNTER — Ambulatory Visit

## 2021-03-14 ENCOUNTER — Ambulatory Visit

## 2021-03-14 DIAGNOSIS — M7989 Other specified soft tissue disorders: Secondary | ICD-10-CM

## 2021-03-14 DIAGNOSIS — S61209A Unspecified open wound of unspecified finger without damage to nail, initial encounter: Secondary | ICD-10-CM

## 2021-03-14 NOTE — Progress Notes
PATIENT: Isaiah Bradford  MRN: [id number]  DOB: 25-Apr-1975  DATE OF SERVICE: 03/14/2021  ATTENDING: Candi Leash, MD, MS  PRIMARY CARE PROVIDER: Unknown     Chief Complaint   Patient presents with   ? Hand Injury     Rt. 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 Pt initially with itchiness around the ring, then itched it and then hand began to swell. Seen yesterday at [facility], rx cephalexin (but did not get script) and a ring was removed from ring finger. Pt concerned bc minimal wound care was provided. Pt also with hx of asthma, currently doesn't have his inhaler.     Social History     Socioeconomic History   ? Marital status: Unknown   .  No past medical history on file.  No past surgical history on file.      No current outpatient medications on file.    Review of Systems   All other systems reviewed and are negative.      Objective:     Last Recorded Vital Signs:    03/14/21 1237   BP: 117/77   Pulse: 86   Resp: 18   Temp: 36.8 ?C (98.3 ?F)   SpO2: 96%         Physical Exam  Vitals reviewed.   Constitutional:       Appearance: Normal appearance.   HENT:      Head: Normocephalic and atraumatic.      Nose: Nose normal.      Mouth/Throat:      Mouth: Mucous membranes are moist.   Eyes:      Conjunctiva/sclera: Conjunctivae normal.   Cardiovascular:      Rate and Rhythm: Normal rate.      Pulses: Normal pulses.   Pulmonary:      Effort: Pulmonary effort is normal.   Abdominal:      General: There is no distension.   Musculoskeletal:      Cervical back: Neck supple.      Comments: Right hand swelling, no erythema or warmth. Wound to ring finger anteriorly and posteriorly where ring was    Skin:     General: Skin is warm.   Neurological:      General: No focal deficit present.      Mental Status: He is alert.   Psychiatric:         Mood and Affect: Mood normal.           Lab Review:      No results found for any previous visit.      No results found for: NA, K, CL, CO2, BUN, CREAT, CALCIUM, GLUCOSE   No results found for: HGB, HCT, WBC, PLT, MCH, MCV   No results found for: NA, K, CL, CO2, BUN, CREAT, CALCIUM, GLUCOSE, TOTPRO, ALBUMIN, BILITOT, ALKPHOS, AST, ALT               Assessment:     There is no problem list on file for this patient.    52M w/ hx of brain aneurysm and asthma presenting with right hand swelling. Here pt is AFVSS, on exam pt has swollen right hand with wound on ring finger where ring likely was. Wound was dressed, will given doxy x 7 days for broader coverage. Pt told to go back to er immediately  if not improving in 24 hours given risk of worsening infection. No evidence of FTS, sepsis, or abscess.   - doxycycline 100mg  BID x 7 days  - wound care supplies   - strict er precautions          . Phrase for different assessments    Plan/ Recommendation:     No orders of the defined types were placed in this encounter.           I discussed the diagnosis and plan of care with patient. I advised the patient to follow-up if symptoms worsen or persist. Follow-up and/or warning parameters reviewed with the patient. I discussed the medication risks, benefits and side effects with the patient.  -     No follow-ups on file.      Candi Leash, MD, MS
